# Patient Record
Sex: Male | Born: 1970 | Hispanic: No | Marital: Married | Smoking: Never smoker
Health system: Southern US, Community
[De-identification: ages and names within clinical notes are randomized; demographics above are authoritative.]

## PROBLEM LIST (undated history)

## (undated) DIAGNOSIS — K219 Gastro-esophageal reflux disease without esophagitis: Secondary | ICD-10-CM

## (undated) DIAGNOSIS — J45909 Unspecified asthma, uncomplicated: Secondary | ICD-10-CM

## (undated) DIAGNOSIS — F329 Major depressive disorder, single episode, unspecified: Secondary | ICD-10-CM

## (undated) DIAGNOSIS — F32A Depression, unspecified: Secondary | ICD-10-CM

## (undated) HISTORY — DX: Gastro-esophageal reflux disease without esophagitis: K21.9

## (undated) HISTORY — DX: Depression, unspecified: F32.A

## (undated) HISTORY — DX: Unspecified asthma, uncomplicated: J45.909

---
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
## 2006-03-08 ENCOUNTER — Ambulatory Visit (HOSPITAL_BASED_OUTPATIENT_CLINIC_OR_DEPARTMENT_OTHER): Admission: RE | Admit: 2006-03-08 | Discharge: 2006-03-08 | Payer: Self-pay | Admitting: General Surgery

## 2010-06-14 ENCOUNTER — Other Ambulatory Visit: Payer: Self-pay | Admitting: Gastroenterology

## 2010-07-11 ENCOUNTER — Other Ambulatory Visit (HOSPITAL_COMMUNITY): Payer: Self-pay | Admitting: Gastroenterology

## 2010-07-11 DIAGNOSIS — R1011 Right upper quadrant pain: Secondary | ICD-10-CM

## 2010-07-18 ENCOUNTER — Encounter (HOSPITAL_COMMUNITY)
Admission: RE | Admit: 2010-07-18 | Discharge: 2010-07-18 | Disposition: A | Discharge status: Home / Self Care | Payer: BC Managed Care – PPO | Source: Ambulatory Visit | Attending: Gastroenterology | Admitting: Gastroenterology

## 2010-07-18 DIAGNOSIS — R1011 Right upper quadrant pain: Secondary | ICD-10-CM | POA: Insufficient documentation

## 2010-07-18 MED ORDER — TECHNETIUM TC 99M MEBROFENIN IV KIT
5.0000 | PACK | Freq: Once | INTRAVENOUS | Status: AC | PRN
  Administered 2010-07-18: 5 via INTRAVENOUS

## 2018-12-06 ENCOUNTER — Other Ambulatory Visit: Payer: Self-pay

## 2018-12-06 DIAGNOSIS — Z20822 Contact with and (suspected) exposure to covid-19: Secondary | ICD-10-CM

## 2018-12-07 LAB — NOVEL CORONAVIRUS, NAA: SARS-CoV-2, NAA: DETECTED — AB

## 2019-05-01 ENCOUNTER — Other Ambulatory Visit: Payer: Self-pay

## 2019-05-01 ENCOUNTER — Ambulatory Visit: Payer: Self-pay | Admitting: Allergy and Immunology

## 2019-05-01 ENCOUNTER — Encounter: Payer: Self-pay | Admitting: Allergy and Immunology

## 2019-05-01 VITALS — BP 134/70 | HR 88 | Temp 98.4°F | Resp 16 | Ht 71.0 in | Wt 269.4 lb

## 2019-05-01 DIAGNOSIS — H1013 Acute atopic conjunctivitis, bilateral: Secondary | ICD-10-CM

## 2019-05-01 DIAGNOSIS — J3089 Other allergic rhinitis: Secondary | ICD-10-CM

## 2019-05-01 DIAGNOSIS — T7800XA Anaphylactic reaction due to unspecified food, initial encounter: Secondary | ICD-10-CM | POA: Insufficient documentation

## 2019-05-01 DIAGNOSIS — J4540 Moderate persistent asthma, uncomplicated: Secondary | ICD-10-CM | POA: Insufficient documentation

## 2019-05-01 DIAGNOSIS — H1010 Acute atopic conjunctivitis, unspecified eye: Secondary | ICD-10-CM | POA: Insufficient documentation

## 2019-05-01 MED ORDER — EPINEPHRINE 0.3 MG/0.3ML IJ SOAJ: 0.3000 mg | Freq: Once | INTRAMUSCULAR | 1 refills | Status: AC

## 2019-05-01 NOTE — Progress Notes (Signed)
New Patient Note  RE: Tyler Potts MRN: [id number] DOB: Nov 03, 1970 Date of Office Visit: 05/01/2019  Referring provider: No ref. provider found Primary care provider: [facility] [place]  Chief Complaint: Allergic Rhinitis , Asthma, and Food Intolerance   History of present illness: Tyler Potts is a 49 y.o. male presenting today for evaluation of rhinitis, asthma, and possible food allergy. He reports that he has had asthma since early childhood, however the asthma improved into adulthood.  In late September 2020 he developed COVID-19, however as other symptoms of the disease resolved, he continued to experience thick postnasal drainage as well as chest tightness.  The chest tightness, coughing, wheezing, and shortness of breath progressed to the point that he started taking albuterol rescue.  He has recently been experiencing asthma symptoms 2-3 times per day.  The asthma is more frequent and severe at nighttime.  He was started on Brio Ellipta 100 g without perceived benefit.  In addition to postnasal drainage, he began to experience throat clearing, nasal congestion, sneezing, coughing, and ocular pruritus.  He started taking a cough syrup and fexofenadine with moderate relief.  No specific environmental triggers have been identified. When he was in his mid 2s he consumed shrimp followed by anaphylactic shock requiring epinephrine rescue in the emergency department.  He has avoided shellfish since that time.  He currently does not have access to an epinephrine autoinjector.  Assessment and plan: Moderate persistent asthma  Prednisone has been provided, 20 mg x 4 days, 10 mg x1 day, then stop.  Samples and a prescription have been provided for BrezTri (budesonide/glycopyrrolate/formoterol) 160/9/4.8 g, 2 inhalations twice a day. To maximize pulmonary deposition, a spacer has been provided along with instructions for its proper administration with an HFA  inhaler.  Continue albuterol HFA, 1 to 2 inhalations every 4-6 hours as needed and 15 minutes prior to exercise.  Subjective and objective measures of pulmonary function will be followed and the treatment plan will be adjusted accordingly.  Perennial and seasonal allergic rhinitis  Aeroallergen avoidance measures have been discussed and provided in written form.  I have recommended levocetirizine (Xyzal) 5 mg daily as needed.  To avoid diminishing benefit with daily use (tachyphylaxis) of second generation antihistamine, consider alternating every few months between fexofenadine (Allegra) and levocetirizine (Xyzal).  Nasacort AQ, 2 sprays per nostril daily as needed.  Continue nasal saline irrigation as needed.  If allergen avoidance measures and medications fail to adequately relieve symptoms, aeroallergen immunotherapy will be considered.  Allergic conjunctivitis  Treatment plan as outlined above for allergic rhinitis.  I have recommended Pataday, one drop per eye daily as needed.  I have also recommended eye lubricant drops (i.e., Natural Tears) as needed.  Allergy with anaphylaxis due to food The patient's history suggests shellfish allergy and positive skin test results today confirm this diagnosis.  Meticulous avoidance of shellfish as discussed.  A prescription has been provided for epinephrine auto-injector 2 pack along with instructions for proper administration.  A food allergy action plan has been provided and discussed.  Medic Alert identification is recommended.   Meds ordered this encounter  Medications  . EPINEPHrine (AUVI-Q) 0.3 mg/0.3 mL IJ SOAJ injection    Sig: Inject 0.3 mLs (0.3 mg total) into the muscle once for 1 dose. As directed for life-threatening allergic reactions    Dispense:  2 each    Refill:  1    Please call [phone] for delivery.    Diagnostics: Spirometry: FVC  was 3.02 L (57% predicted) and FEV1 was 2.49 L (60% predicted)  without postbronchodilator improvement.  Restrictive pattern without reversibility today.  This study was performed while the patient was asymptomatic.  Please see scanned spirometry results for details. Environmental skin testing: Positive to grass pollen, ragweed pollen, weed pollen, dust mite antigen, and cockroach antigen. Food allergen skin testing: Robust reactivity to shellfish mix, shrimp, crab, lobster, oyster, and scallops.    Physical examination: Blood pressure 134/70, pulse 88, temperature 98.4 F (36.9 C), temperature source Oral, resp. rate 16, height 5\' 11"  (1.803 m), weight 269 lb 6.4 oz (122.2 kg), SpO2 96 %.  General: Alert, interactive, in no acute distress. HEENT: TMs pearly gray, turbinates moderately edematous with clear discharge, post-pharynx moderately erythematous. Neck: Supple without lymphadenopathy. Lungs: Slightly decreased breath sounds bilaterally without wheezing, rhonchi or rales. CV: Normal S1, S2 without murmurs. Abdomen: Nondistended, nontender. Skin: Warm and dry, without lesions or rashes. Extremities:  No clubbing, cyanosis or edema. Neuro:   Grossly intact.  Review of systems:  Review of systems negative except as noted in HPI / PMHx or noted below: Review of Systems  Constitutional: Negative.   HENT: Negative.   Eyes: Negative.   Respiratory: Negative.   Cardiovascular: Negative.   Gastrointestinal: Negative.   Genitourinary: Negative.   Musculoskeletal: Negative.   Skin: Negative.   Neurological: Negative.   Endo/Heme/Allergies: Negative.   Psychiatric/Behavioral: Negative.     Past medical history:  Past Medical History:  Diagnosis Date  . Asthma   . Depression   . GERD (gastroesophageal reflux disease)     Past surgical history:  Past Surgical History:  Procedure Laterality Date  . EYE SURGERY Bilateral    greater than 40 years ago.  . inguinal surgery Right     Family history: Family History  Problem Relation Age of  Onset  . Allergic rhinitis Son   . Angioedema Neg Hx   . Asthma Neg Hx   . Eczema Neg Hx   . Immunodeficiency Neg Hx   . Urticaria Neg Hx     Social history: Social History   Socioeconomic History  . Marital status: Married    Spouse name: Not on file  . Number of children: Not on file  . Years of education: Not on file  . Highest education level: Not on file  Occupational History  . Not on file  Tobacco Use  . Smoking status: Never Smoker  . Smokeless tobacco: Never Used  Substance and Sexual Activity  . Alcohol use: Not Currently  . Drug use: Never  . Sexual activity: Not on file  Other Topics Concern  . Not on file  Social History Narrative  . Not on file   Social Determinants of Health   Financial Resource Strain:   . Difficulty of Paying Living Expenses: Not on file  Food Insecurity:   . Worried About in the Last Year: Not on file  . Ran Out of Food in the Last Year: Not on file  Transportation Needs:   . Lack of Transportation (Medical): Not on file  . Lack of Transportation (Non-Medical): Not on file  Physical Activity:   . Days of Exercise per Week: Not on file  . Minutes of Exercise per Session: Not on file  Stress:   . Feeling of Stress : Not on file  Social Connections:   . Frequency of Communication with Friends and Family: Not on file  . Frequency of Social Gatherings  with Friends and Family: Not on file  . Attends Religious Services: Not on file  . Active Member of Clubs or Organizations: Not on file  . Attends Archivist Meetings: Not on file  . Marital Status: Not on file  Intimate Partner Violence:   . Fear of Current or Ex-Partner: Not on file  . Emotionally Abused: Not on file  . Physically Abused: Not on file  . Sexually Abused: Not on file    Environmental History: The patient lives in 47 year old house with carpeting in the bedroom and central air/heat.  There are 2 dogs in the home which have access to  his bedroom.  There is mold/water damage in the home.  He is a non-smoker.  Current Outpatient Medications  Medication Sig Dispense Refill  . ALBUTEROL IN Inhale into the lungs.    . citalopram (CELEXA) 20 MG tablet Take 20 mg by mouth daily. Takes 1/2 tablet every day    . EPINEPHrine (EPIPEN 2-PAK) 0.3 mg/0.3 mL IJ SOAJ injection Inject 0.3 mg into the muscle as needed for anaphylaxis.    . famotidine (PEPCID) 20 MG tablet Take 20 mg by mouth daily.    . fluticasone furoate-vilanterol (BREO ELLIPTA) 100-25 MCG/INH AEPB Inhale 1 puff into the lungs daily.    Marland Kitchen omeprazole (PRILOSEC) 20 MG capsule Take 20 mg by mouth 2 (two) times daily.    Marland Kitchen EPINEPHrine (AUVI-Q) 0.3 mg/0.3 mL IJ SOAJ injection Inject 0.3 mLs (0.3 mg total) into the muscle once for 1 dose. As directed for life-threatening allergic reactions 2 each 1   No current facility-administered medications for this visit.    Known medication allergies: Allergies  Allergen Reactions  . Iodine Anaphylaxis    I appreciate the opportunity to take part in Winson's care. Please do not hesitate to contact me with questions.  Sincerely,   R. Edgar Frisk, MD

## 2019-05-01 NOTE — Assessment & Plan Note (Signed)
The patient's history suggests shellfish allergy and positive skin test results today confirm this diagnosis.  Meticulous avoidance of shellfish as discussed.  A prescription has been provided for epinephrine auto-injector 2 pack along with instructions for proper administration.  A food allergy action plan has been provided and discussed.  Medic Alert identification is recommended. 

## 2019-05-01 NOTE — Assessment & Plan Note (Addendum)
   Prednisone has been provided, 20 mg x 4 days, 10 mg x1 day, then stop.  Samples and a prescription have been provided for BrezTri (budesonide/glycopyrrolate/formoterol) 160/9/4.8 g, 2 inhalations twice a day. To maximize pulmonary deposition, a spacer has been provided along with instructions for its proper administration with an HFA inhaler.  Continue albuterol HFA, 1 to 2 inhalations every 4-6 hours as needed and 15 minutes prior to exercise.  Subjective and objective measures of pulmonary function will be followed and the treatment plan will be adjusted accordingly.

## 2019-05-01 NOTE — Assessment & Plan Note (Signed)
   Aeroallergen avoidance measures have been discussed and provided in written form.  I have recommended levocetirizine (Xyzal) 5 mg daily as needed.  To avoid diminishing benefit with daily use (tachyphylaxis) of second generation antihistamine, consider alternating every few months between fexofenadine (Allegra) and levocetirizine (Xyzal).  Nasacort AQ, 2 sprays per nostril daily as needed.  Continue nasal saline irrigation as needed.  If allergen avoidance measures and medications fail to adequately relieve symptoms, aeroallergen immunotherapy will be considered.

## 2019-05-01 NOTE — Patient Instructions (Addendum)
Moderate persistent asthma  Prednisone has been provided, 20 mg x 4 days, 10 mg x1 day, then stop.  Samples and a prescription have been provided for BrezTri (budesonide/glycopyrrolate/formoterol) 160/9/4.8 g, 2 inhalations twice a day. To maximize pulmonary deposition, a spacer has been provided along with instructions for its proper administration with an HFA inhaler.  Continue albuterol HFA, 1 to 2 inhalations every 4-6 hours as needed and 15 minutes prior to exercise.  Subjective and objective measures of pulmonary function will be followed and the treatment plan will be adjusted accordingly.  Perennial and seasonal allergic rhinitis  Aeroallergen avoidance measures have been discussed and provided in written form.  I have recommended levocetirizine (Xyzal) 5 mg daily as needed.  To avoid diminishing benefit with daily use (tachyphylaxis) of second generation antihistamine, consider alternating every few months between fexofenadine (Allegra) and levocetirizine (Xyzal).  Nasacort AQ, 2 sprays per nostril daily as needed.  Continue nasal saline irrigation as needed.  If allergen avoidance measures and medications fail to adequately relieve symptoms, aeroallergen immunotherapy will be considered.  Allergic conjunctivitis  Treatment plan as outlined above for allergic rhinitis.  I have recommended Pataday, one drop per eye daily as needed.  I have also recommended eye lubricant drops (i.e., Natural Tears) as needed.  Allergy with anaphylaxis due to food The patient's history suggests shellfish allergy and positive skin test results today confirm this diagnosis.  Meticulous avoidance of shellfish as discussed.  A prescription has been provided for epinephrine auto-injector 2 pack along with instructions for proper administration.  A food allergy action plan has been provided and discussed.  Medic Alert identification is recommended.   Return in about 2 months (around  06/29/2019), or if symptoms worsen or fail to improve.  Control of House Dust Mite Allergen  House dust mites play a major role in allergic asthma and rhinitis.  They occur in environments with high humidity wherever human skin, the food for dust mites is found. High levels have been detected in dust obtained from mattresses, pillows, carpets, upholstered furniture, bed covers, clothes and soft toys.  The principal allergen of the house dust mite is found in its feces.  A gram of dust may contain 1,000 mites and 250,000 fecal particles.  Mite antigen is easily measured in the air during house cleaning activities.    1. Encase mattresses, including the box spring, and pillow, in an air tight cover.  Seal the zipper end of the encased mattresses with wide adhesive tape. 2. Wash the bedding in water of 130 degrees Farenheit weekly.  Avoid cotton comforters/quilts and flannel bedding: the most ideal bed covering is the dacron comforter. 3. Remove all upholstered furniture from the bedroom. 4. Remove carpets, carpet padding, rugs, and non-washable window drapes from the bedroom.  Wash drapes weekly or use plastic window coverings. 5. Remove all non-washable stuffed toys from the bedroom.  Wash stuffed toys weekly. 6. Have the room cleaned frequently with a vacuum cleaner and a damp dust-mop.  The patient should not be in a room which is being cleaned and should wait 1 hour after cleaning before going into the room. 7. Close and seal all heating outlets in the bedroom.  Otherwise, the room will become filled with dust-laden air.  An electric heater can be used to heat the room. 8. Reduce indoor humidity to less than 50%.  Do not use a humidifier.  Reducing Pollen Exposure  The American Academy of Allergy, Asthma and Immunology suggests the following steps  to reduce your exposure to pollen during allergy seasons.    1. Do not hang sheets or clothing out to dry; pollen may collect on these items. 2. Do  not mow lawns or spend time around freshly cut grass; mowing stirs up pollen. 3. Keep windows closed at night.  Keep car windows closed while driving. 4. Minimize morning activities outdoors, a time when pollen counts are usually at their highest. 5. Stay indoors as much as possible when pollen counts or humidity is high and on windy days when pollen tends to remain in the air longer. 6. Use air conditioning when possible.  Many air conditioners have filters that trap the pollen spores. 7. Use a HEPA room air filter to remove pollen form the indoor air you breathe.   Control of Cockroach Allergen  Cockroach allergen has been identified as an important cause of acute attacks of asthma, especially in urban settings.  There are fifty-five species of cockroach that exist in the [place], however only three, the [place], [place] species produce allergen that can affect patients with Asthma.  Allergens can be obtained from fecal particles, egg casings and secretions from cockroaches.    1. Remove food sources. 2. Reduce access to water. 3. Seal access and entry points. 4. Spray runways with 0.5-1% Diazinon or Chlorpyrifos 5. Blow boric acid power under stoves and refrigerator. 6. Place bait stations (hydramethylnon) at feeding sites.

## 2019-05-01 NOTE — Assessment & Plan Note (Signed)
   Treatment plan as outlined above for allergic rhinitis.  I have recommended Pataday, one drop per eye daily as needed.  I have also recommended eye lubricant drops (i.e., Natural Tears) as needed.

## 2019-05-07 ENCOUNTER — Telehealth: Payer: Self-pay | Admitting: Allergy and Immunology

## 2019-05-07 NOTE — Telephone Encounter (Signed)
Patient saw Dr. Nunzio Cobbs on 05/01/2019. He said he was given an inhaler and has a question about that and also would like to know the difference between asthma and COPD. He said he was in a store today and something triggered his asthma. He said he is fine now, but would like to talk about those two things.

## 2019-05-09 NOTE — Telephone Encounter (Signed)
Spoke with patient. Information given. Patient has no insurance so the coupon is only taken off 100.00. Told patient we were going to call rep to see if we could get more samples to give him.

## 2019-05-09 NOTE — Telephone Encounter (Signed)
Please call back patient.  This is a Dr. Nunzio Cobbs patient.  I'm not sure how long he wanted patient to continue on the inhaler. He says follow up in 2 months so I'm assuming he wanted him to continue the inhaler for at least until the next visit.  Tyler Potts has a coupon to use to help with cost. If it's too expensive then I'll let Dr. Nunzio Cobbs decide the change in inhalers when he is back in the office on Monday.   Please give him this website where he can read about asthma and copd.  [email or web address]

## 2019-05-09 NOTE — Telephone Encounter (Signed)
Patient called back this morning in regards to his phone message taken 2 days ago. Told patient we were out of the office due to the weather. He states he is 90% better being on the Bretzi(2 puffs BID) but wanted to know how long he needed to be on it? It was 500.00 for him at the pharmacy. He also wanted to know the difference between asthma and COPD. Patient didn't want to wait over the weekend til Dr. Nunzio Cobbs was in the office so can you please advise. Thanks

## 2019-07-02 ENCOUNTER — Ambulatory Visit: Payer: Self-pay | Admitting: Allergy and Immunology

## 2019-07-09 ENCOUNTER — Ambulatory Visit: Payer: Self-pay | Admitting: Pediatrics

## 2019-08-28 ENCOUNTER — Ambulatory Visit: Payer: Self-pay | Admitting: Allergy and Immunology

## 2021-08-09 ENCOUNTER — Telehealth: Payer: Self-pay

## 2021-08-09 NOTE — Telephone Encounter (Signed)
Patient was concerned that when he has the  urge to sneeze but he can't patient wanted to make sure it wasn't life threatening. I told the patient it wasn't life threatening if he has the urge to sneeze and he doesn't.

## 2021-08-28 ENCOUNTER — Emergency Department (HOSPITAL_BASED_OUTPATIENT_CLINIC_OR_DEPARTMENT_OTHER)
Admission: EM | Admit: 2021-08-28 | Discharge: 2021-08-28 | Discharge status: Absent without leave | Payer: Self-pay | Source: Home / Self Care

## 2021-08-28 ENCOUNTER — Other Ambulatory Visit: Payer: Self-pay

## 2021-09-06 ENCOUNTER — Telehealth: Payer: Self-pay | Admitting: Internal Medicine

## 2021-09-06 NOTE — Telephone Encounter (Signed)
Pt would like to know if he has been tested for gluten or dairy within the last few years.

## 2021-09-06 NOTE — Telephone Encounter (Signed)
Lm for pt to call us back about this.  It seems pt has not been tested for gluten or dairy

## 2021-09-21 ENCOUNTER — Encounter: Payer: Self-pay | Admitting: Internal Medicine

## 2021-09-21 NOTE — Progress Notes (Deleted)
Follow Up Note  RE: Tyler Potts MRN: [id number] DOB: 03-22-1970 Date of Office Visit: 09/21/2021  Referring provider: [facility]* Primary care provider: [facility] [place]  Chief Complaint: No chief complaint on file.  History of Present Illness: I had the pleasure of seeing Tyler Potts for a follow up visit at the [facility] on 09/21/2021. He is a 51 y.o. male, who is being followed for rhinitis, asthma, food allergy. His previous allergy office visit was on 04/30/2018 with Dr. Nunzio Cobbs. Today is a skin testing and follow up visit.  History obtained from patient  and {Blank single:19197::"mother","father","interpreter"}.  ASTHMA - Medical therapy: *** - Rescue inhaler use: *** - Symptoms: *** - Exacerbation history: *** ABX for respiratory illness since last visit, *** OCS, ***ED, *** UC visits in the past year  - ACT: *** /25 - Adverse effects of medication: *** - Previous FEV1: 2.49 L, 60% - Biologic Labs ***  Seasonal and perennial rhinitis: current therapy: ***,  symptoms {Blank single:19197::"improved","partially improved","not improved"} symptoms include: {Blank multiple:19196:a:"***","nasal congestion","rhinorrhea","post nasal drainage","sneezing","watery eyes","itchy eyes","itchy nose"} Previous allergy testing:  05/01/2019 Positive to grass pollen, ragweed pollen, weed pollen, dust mite antigen, and cockroach antigen. History of reflux/heartburn: {Blank single:19197::"yes","no"} Interested in Allergy Immunotherapy: {Blank single:19197::"yes","no"}  Food Allergy: continues to avoid *** -*** accidental exposures - *** use of epinephrine -Previous testing: 05/01/2019: Robust reactivity to shellfish mix, shrimp, crab, lobster, oyster, and scallops.   Assessment and Plan: Tyler Potts is a 51 y.o. male with: No diagnosis found. Plan: There are no Patient Instructions on file for this visit. No follow-ups on file.  No  orders of the defined types were placed in this encounter.   Lab Orders  No laboratory test(s) ordered today   Diagnostics: Spirometry:  Tracings reviewed. His effort: {Blank single:19197::"Good reproducible efforts.","It was hard to get consistent efforts and there is a question as to whether this reflects a maximal maneuver.","Poor effort, data can not be interpreted."} FVC: ***L FEV1: ***L, ***% predicted FEV1/FVC ratio: ***% Interpretation: {Blank single:19197::"Spirometry consistent with mild obstructive disease","Spirometry consistent with moderate obstructive disease","Spirometry consistent with severe obstructive disease","Spirometry consistent with possible restrictive disease","Spirometry consistent with mixed obstructive and restrictive disease","Spirometry uninterpretable due to technique","Spirometry consistent with normal pattern","No overt abnormalities noted given today's efforts"}.  Please see scanned spirometry results for details.  Skin Testing: {Blank single:19197::"Select foods","Environmental allergy panel","Environmental allergy panel and select foods","Food allergy panel","None","Deferred due to recent antihistamines use"}. *** Results interpreted by myself during this encounter and discussed with patient/family.   Medication List:  Current Outpatient Medications  Medication Sig Dispense Refill   ALBUTEROL IN Inhale into the lungs.     citalopram (CELEXA) 20 MG tablet Take 20 mg by mouth daily. Takes 1/2 tablet every day     EPINEPHrine (EPIPEN 2-PAK) 0.3 mg/0.3 mL IJ SOAJ injection Inject 0.3 mg into the muscle as needed for anaphylaxis.     famotidine (PEPCID) 20 MG tablet Take 20 mg by mouth daily.     fluticasone furoate-vilanterol (BREO ELLIPTA) 100-25 MCG/INH AEPB Inhale 1 puff into the lungs daily.     omeprazole (PRILOSEC) 20 MG capsule Take 20 mg by mouth 2 (two) times daily.     No current facility-administered medications for this visit.    Allergies: Allergies  Allergen Reactions   Iodine Anaphylaxis   I reviewed his past medical history, social history, family history, and environmental history and no significant changes have been reported from his previous visit.  ROS: All others negative except as noted per HPI.   Objective: There were no vitals taken for this visit. There is no height or weight on file to calculate BMI. General Appearance:  Alert, cooperative, no distress, appears stated age  Head:  Normocephalic, without obvious abnormality, atraumatic  Eyes:  Conjunctiva clear, EOM's intact  Nose: Nares normal, {Blank multiple:19196:a:"***","hypertrophic turbinates","normal mucosa","no visible anterior polyps","septum midline"}  Throat: Lips, tongue normal; teeth and gums normal, {Blank multiple:19196:a:"***","normal posterior oropharynx","tonsils 2+","tonsils 3+","no tonsillar exudate","+ cobblestoning"}  Neck: Supple, symmetrical  Lungs:   {Blank multiple:19196:a:"***","clear to auscultation bilaterally","end-expiratory wheezing","wheezing throughout"}, Respirations unlabored, {Blank multiple:19196:a:"***","no coughing","intermittent dry coughing"}  Heart:  {Blank multiple:19196:a:"***","regular rate and rhythm","no murmur"}, Appears well perfused  Extremities: No edema  Skin: Skin color, texture, turgor normal, no rashes or lesions on visualized portions of skin  Neurologic: No gross deficits   Previous notes and tests were reviewed. The plan was reviewed with the patient/family, and all questions/concerned were addressed.  It was my pleasure to see Tyler Potts today and participate in his care. Please feel free to contact me with any questions or concerns.  Sincerely,  Ferol Luz, MD  Allergy & Immunology  [facility] Office: [phone]

## 2021-10-05 NOTE — Progress Notes (Signed)
This encounter was created in error - please disregard.

## 2024-01-21 ENCOUNTER — Other Ambulatory Visit: Payer: Self-pay | Admitting: Gastroenterology

## 2024-01-21 DIAGNOSIS — R1010 Upper abdominal pain, unspecified: Secondary | ICD-10-CM
